# Patient Record
Sex: Male | Born: 1970 | Race: White | Hispanic: No | Marital: Married | Smoking: Never smoker
Health system: Southern US, Community
[De-identification: ages and names within clinical notes are randomized; demographics above are authoritative.]

## PROBLEM LIST (undated history)

## (undated) DIAGNOSIS — J45909 Unspecified asthma, uncomplicated: Secondary | ICD-10-CM

## (undated) HISTORY — PX: FOREARM SURGERY: SHX651

## (undated) HISTORY — PX: HERNIA REPAIR: SHX51

---

## 2005-03-16 ENCOUNTER — Inpatient Hospital Stay: Payer: Self-pay | Admitting: Unknown Physician Specialty

## 2006-12-11 IMAGING — CR DG CHEST 1V PORT
1 series · 1 of 1 positions shown · non-contrast
Comparison: none

REASON FOR EXAM: RIGHT WRIST FRACTURE
COMMENTS:

PROCEDURE:     DXR - DXR PORTABLE CHEST SINGLE VIEW  - March 17, 2005  [DATE]
RESULT:          No acute cardiopulmonary disease is noted.

[view not recorded]
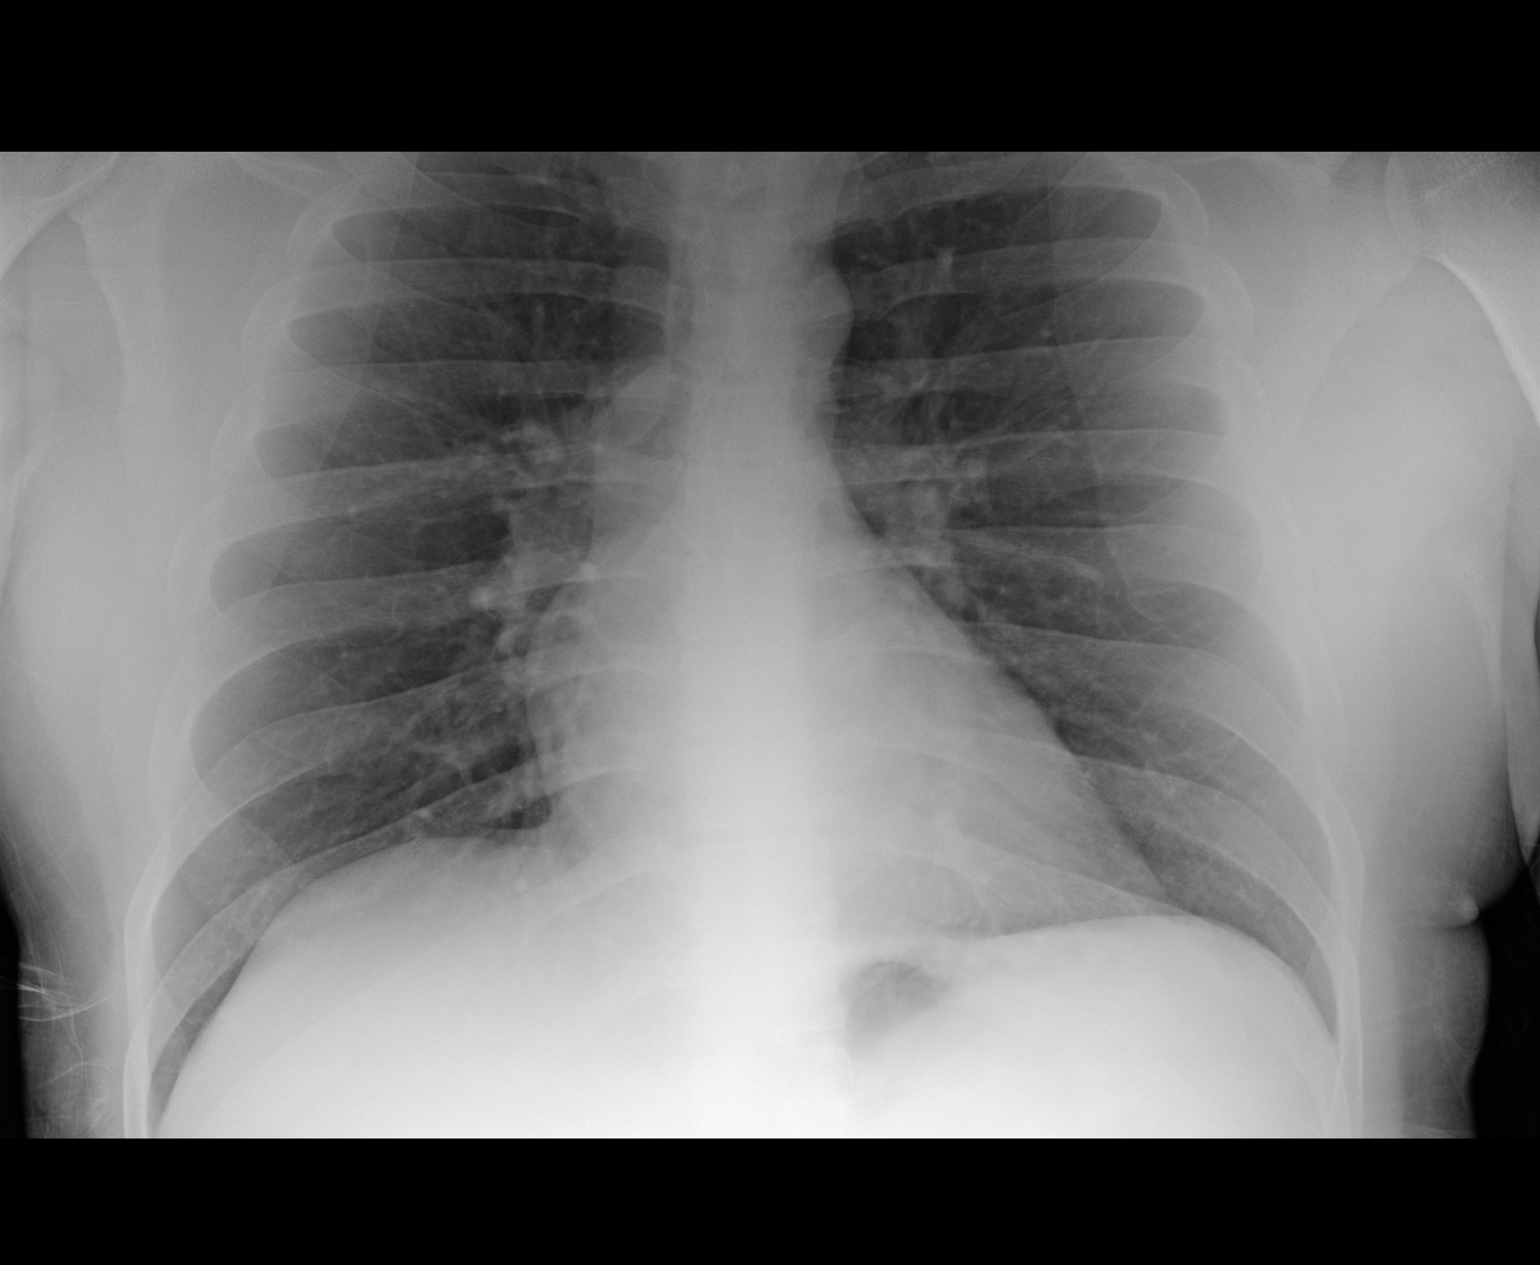

[1 of 1 positions shown; findings below may reference images not displayed]

IMPRESSION: No acute disease.

## 2007-04-17 ENCOUNTER — Emergency Department: Payer: Self-pay | Admitting: Emergency Medicine

## 2010-04-06 ENCOUNTER — Emergency Department: Payer: Self-pay | Admitting: Emergency Medicine

## 2011-04-30 ENCOUNTER — Emergency Department: Payer: Self-pay | Admitting: Emergency Medicine

## 2011-04-30 LAB — URINALYSIS, COMPLETE
Bilirubin,UR: NEGATIVE
Blood: NEGATIVE
Glucose,UR: NEGATIVE mg/dL (ref 0–75)
Ketone: NEGATIVE
Leukocyte Esterase: NEGATIVE
Ph: 5 (ref 4.5–8.0)
Specific Gravity: 1.026 (ref 1.003–1.030)
WBC UR: 2 /HPF (ref 0–5)

## 2011-04-30 LAB — CBC WITH DIFFERENTIAL/PLATELET
Basophil #: 0 10*3/uL (ref 0.0–0.1)
Basophil %: 0.2 %
Eosinophil #: 0.1 10*3/uL (ref 0.0–0.7)
Eosinophil %: 1.1 %
Lymphocyte %: 19.6 %
MCHC: 34 g/dL (ref 32.0–36.0)
MCV: 96 fL (ref 80–100)
Neutrophil #: 9.5 10*3/uL — ABNORMAL HIGH (ref 1.4–6.5)
RBC: 5.12 10*6/uL (ref 4.40–5.90)
RDW: 12.9 % (ref 11.5–14.5)

## 2011-04-30 LAB — COMPREHENSIVE METABOLIC PANEL
BUN: 13 mg/dL (ref 7–18)
Calcium, Total: 8.3 mg/dL — ABNORMAL LOW (ref 8.5–10.1)
Chloride: 103 mmol/L (ref 98–107)
Co2: 27 mmol/L (ref 21–32)
Creatinine: 0.88 mg/dL (ref 0.60–1.30)
EGFR (Non-African Amer.): >60
SGPT (ALT): 61 U/L

## 2015-02-13 ENCOUNTER — Emergency Department
Admission: EM | Admit: 2015-02-13 | Discharge: 2015-02-13 | Disposition: A | Discharge status: Home / Self Care | Payer: Self-pay | Attending: Emergency Medicine | Admitting: Emergency Medicine

## 2015-02-13 ENCOUNTER — Encounter: Payer: Self-pay | Admitting: Emergency Medicine

## 2015-02-13 DIAGNOSIS — E1165 Type 2 diabetes mellitus with hyperglycemia: Secondary | ICD-10-CM | POA: Insufficient documentation

## 2015-02-13 HISTORY — DX: Unspecified asthma, uncomplicated: J45.909

## 2015-02-13 LAB — CBC WITH DIFFERENTIAL/PLATELET
BASOS PCT: 1 %
Basophils Absolute: 0 10*3/uL (ref 0–0.1)
Eosinophils Absolute: 0.3 10*3/uL (ref 0–0.7)
Eosinophils Relative: 4 %
HEMATOCRIT: 45.3 % (ref 40.0–52.0)
Hemoglobin: 15.9 g/dL (ref 13.0–18.0)
Lymphocytes Relative: 34 %
Lymphs Abs: 2.6 10*3/uL (ref 1.0–3.6)
MCH: 32 pg (ref 26.0–34.0)
MCHC: 35.2 g/dL (ref 32.0–36.0)
MCV: 91 fL (ref 80.0–100.0)
MONO ABS: 0.5 10*3/uL (ref 0.2–1.0)
Monocytes Relative: 6 %
Neutro Abs: 4.1 10*3/uL (ref 1.4–6.5)
Neutrophils Relative %: 55 %
PLATELETS: 194 10*3/uL (ref 150–440)
RBC: 4.98 MIL/uL (ref 4.40–5.90)
RDW: 13 % (ref 11.5–14.5)
WBC: 7.5 10*3/uL (ref 3.8–10.6)

## 2015-02-13 LAB — COMPREHENSIVE METABOLIC PANEL
ALBUMIN: 4 g/dL (ref 3.5–5.0)
ALT: 25 U/L (ref 17–63)
AST: 19 U/L (ref 15–41)
Alkaline Phosphatase: 104 U/L (ref 38–126)
Anion gap: 7 (ref 5–15)
BILIRUBIN TOTAL: 1.2 mg/dL (ref 0.3–1.2)
BUN: 12 mg/dL (ref 6–20)
CHLORIDE: 101 mmol/L (ref 101–111)
CO2: 28 mmol/L (ref 22–32)
Calcium: 8.6 mg/dL — ABNORMAL LOW (ref 8.9–10.3)
Creatinine, Ser: 0.64 mg/dL (ref 0.61–1.24)
GFR calc Af Amer: >60 mL/min (ref >60)
GFR calc non Af Amer: >60 mL/min (ref >60)
GLUCOSE: 282 mg/dL — AB (ref 65–99)
POTASSIUM: 3.6 mmol/L (ref 3.5–5.1)
Sodium: 136 mmol/L (ref 135–145)
Total Protein: 7.3 g/dL (ref 6.5–8.1)

## 2015-02-13 LAB — TROPONIN I: Troponin I: <0.03 ng/mL (ref <0.031)

## 2015-02-13 MED ORDER — SODIUM CHLORIDE 0.9 % IV SOLN
1000.0000 mL | Freq: Once | INTRAVENOUS | Status: AC
  Administered 2015-02-13: 1000 mL via INTRAVENOUS

## 2015-02-13 MED ORDER — METFORMIN HCL 500 MG PO TABS: 500.0000 mg | ORAL_TABLET | Freq: Two times a day (BID) | ORAL | Status: AC

## 2015-02-13 NOTE — ED Notes (Signed)
States has felt fatigued x 2 months. States went to [place] clinic today and blood sugar was "HI". States is not a known diabetic.

## 2015-02-13 NOTE — ED Provider Notes (Signed)
[facility] Emergency Department Provider Note  ____________________________________________    I have reviewed the triage vital signs and the nursing notes.   HISTORY  Chief Complaint Fatigue  elevated glucose   HPI Erik Craig is a 45 y.o. male who presents with complaints of elevated blood sugar. Patient was sent from clinic with "high" glucose. Patient has no history of diabetes. He complains of thirst, frequent urination. Denies fevers or chills. No nausea no vomiting. No abdominal pain. No recent travel.     Past Medical History  Diagnosis Date  . Asthma     There are no active problems to display for this patient.   History reviewed. No pertinent past surgical history.  No current outpatient prescriptions on file.  Allergies Sulfa antibiotics  No family history on file.  Social History Social History  Substance Use Topics  . Smoking status: Never Smoker   . Smokeless tobacco: None  . Alcohol Use: No    Review of Systems  Constitutional: Negative for fever. Eyes: Negative for visual changes. ENT:  polydipsia Cardiovascular: Negative for chest pain. Respiratory: Negative for shortness of breath. No cough Gastrointestinal: Negative for abdominal pain, vomiting and diarrhea. Genitourinary: Negative for dysuria. Polyuria Musculoskeletal: Negative for back pain. Skin: Negative for rash. Neurological: Negative for headaches or focal weakness Psychiatric: No anxiety    ____________________________________________   PHYSICAL EXAM:  VITAL SIGNS: ED Triage Vitals  Enc Vitals Group     BP 02/13/15 1911 130/95 mmHg     Pulse Rate 02/13/15 1911 67     Resp 02/13/15 1911 18     Temp 02/13/15 1911 98.2 F (36.8 C)     Temp Source 02/13/15 1911 Oral     SpO2 02/13/15 1911 95 %     Weight 02/13/15 1911 231 lb (104.781 kg)     Height 02/13/15 1911  (1.753 m)     Head Cir --      Peak Flow --      Pain Score --       Pain Loc --      Pain Edu? --      Excl. in GC? --     Constitutional: Alert and oriented. Well appearing and in no distress. Eyes: Conjunctivae are normal.  ENT   Head: Normocephalic and atraumatic.   Mouth/Throat: Mucous membranes are moist. Cardiovascular: Normal rate, regular rhythm. Normal and symmetric distal pulses are present in all extremities. Respiratory: Normal respiratory effort without tachypnea nor retractions.  Gastrointestinal: Soft and non-tender in all quadrants. No distention. There is no CVA tenderness. Genitourinary: deferred Musculoskeletal: Nontender with normal range of motion in all extremities. No lower extremity tenderness nor edema. Neurologic:  Normal speech and language. No gross focal neurologic deficits are appreciated. Skin:  Skin is warm, dry and intact. No rash noted. Psychiatric: Mood and affect are normal. Patient exhibits appropriate insight and judgment.  ____________________________________________    LABS (pertinent positives/negatives)  Labs Reviewed  COMPREHENSIVE METABOLIC PANEL - Abnormal; Notable for the following:    Glucose, Bld 282 (*)    Calcium 8.6 (*)    All other components within normal limits  CBC WITH DIFFERENTIAL/PLATELET  TROPONIN I  URINALYSIS COMPLETEWITH MICROSCOPIC ([facility] ONLY)  CBG MONITORING, ED    ____________________________________________   EKG  ED ECG REPORT I, Jene Every, the attending physician, personally viewed and interpreted this ECG.  Date: 02/13/2015 EKG Time: 9:58 PM Rate: 57 Rhythm: Sinus bradycardia QRS Axis: normal Intervals: normal ST/T  Wave abnormalities: normal Conduction Disturbances: none Narrative Interpretation: unremarkable   ____________________________________________    RADIOLOGY I have personally reviewed any xrays that were ordered on this patient: None  ____________________________________________   PROCEDURES  Procedure(s) performed:  none  Critical Care performed: none  ____________________________________________   INITIAL IMPRESSION / ASSESSMENT AND PLAN / ED COURSE  Pertinent labs & imaging results that were available during my care of the patient were reviewed by me and considered in my medical decision making (see chart for details).  Patient sent from clinic with elevated glucose. We will check labs, give IV fluids and reevaluate.  Glucose on CMP is 282. LFTs are normal. I will start metformin and the patient will follow-up with his PCP in one week. Diabetes education provided by me  ____________________________________________   FINAL CLINICAL IMPRESSION(S) / ED DIAGNOSES  Final diagnoses:  Type 2 diabetes mellitus with hyperglycemia, without long-term current use of insulin (HCC)   new onset diabetes   Jene Every, MD 02/13/15 2252

## 2015-02-13 NOTE — Discharge Instructions (Signed)
Diabetes and Exercise Exercising regularly is important. It is not just about losing weight. It has many health benefits, such as:  Improving your overall fitness, flexibility, and endurance.  Increasing your bone density.  Helping with weight control.  Decreasing your body fat.  Increasing your muscle strength.  Reducing stress and tension.  Improving your overall health. People with diabetes who exercise gain additional benefits because exercise:  Reduces appetite.  Improves the body's use of blood sugar (glucose).  Helps lower or control blood glucose.  Decreases blood pressure.  Helps control blood lipids (such as cholesterol and triglycerides).  Improves the body's use of the hormone insulin by:  Increasing the body's insulin sensitivity.  Reducing the body's insulin needs.  Decreases the risk for heart disease because exercising:  Lowers cholesterol and triglycerides levels.  Increases the levels of good cholesterol (such as high-density lipoproteins [HDL]) in the body.  Lowers blood glucose levels. YOUR ACTIVITY PLAN  Choose an activity that you enjoy, and set realistic goals. To exercise safely, you should begin practicing any new physical activity slowly, and gradually increase the intensity of the exercise over time. Your health care provider or diabetes educator can help create an activity plan that works for you. General recommendations include:  Encouraging children to engage in at least 60 minutes of physical activity each day.  Stretching and performing strength training exercises, such as yoga or weight lifting, at least 2 times per week.  Performing a total of at least 150 minutes of moderate-intensity exercise each week, such as brisk walking or water aerobics.  Exercising at least 3 days per week, making sure you allow no more than 2 consecutive days to pass without exercising.  Avoiding long periods of inactivity (90 minutes or more). When you  have to spend an extended period of time sitting down, take frequent breaks to walk or stretch. RECOMMENDATIONS FOR EXERCISING WITH TYPE 1 OR TYPE 2 DIABETES   Check your blood glucose before exercising. If blood glucose levels are greater than 240 mg/dL, check for urine ketones. Do not exercise if ketones are present.  Avoid injecting insulin into areas of the body that are going to be exercised. For example, avoid injecting insulin into:  The arms when playing tennis.  The legs when jogging.  Keep a record of:  Food intake before and after you exercise.  Expected peak times of insulin action.  Blood glucose levels before and after you exercise.  The type and amount of exercise you have done.  Review your records with your health care provider. Your health care provider will help you to develop guidelines for adjusting food intake and insulin amounts before and after exercising.  If you take insulin or oral hypoglycemic agents, watch for signs and symptoms of hypoglycemia. They include:  Dizziness.  Shaking.  Sweating.  Chills.  Confusion.  Drink plenty of water while you exercise to prevent dehydration or heat stroke. Body water is lost during exercise and must be replaced.  Talk to your health care provider before starting an exercise program to make sure it is safe for you. Remember, almost any type of activity is better than none.   This information is not intended to replace advice given to you by your health care provider. Make sure you discuss any questions you have with your health care provider.   Document Released: 03/16/2003 Document Revised: 05/10/2014 Document Reviewed: 06/02/2012 Elsevier Interactive Patient Education 2016 [employer].  Diabetes Mellitus and Food It is important  for you to manage your blood sugar (glucose) level. Your blood glucose level can be greatly affected by what you eat. Eating healthier foods in the appropriate amounts throughout  the day at about the same time each day will help you control your blood glucose level. It can also help slow or prevent worsening of your diabetes mellitus. Healthy eating may even help you improve the level of your blood pressure and reach or maintain a healthy weight.  General recommendations for healthful eating and cooking habits include:  Eating meals and snacks regularly. Avoid going long periods of time without eating to lose weight.  Eating a diet that consists mainly of plant-based foods, such as fruits, vegetables, nuts, legumes, and whole grains.  Using low-heat cooking methods, such as baking, instead of high-heat cooking methods, such as deep frying. Work with your dietitian to make sure you understand how to use the Nutrition Facts information on food labels. HOW CAN FOOD AFFECT ME? Carbohydrates Carbohydrates affect your blood glucose level more than any other type of food. Your dietitian will help you determine how many carbohydrates to eat at each meal and teach you how to count carbohydrates. Counting carbohydrates is important to keep your blood glucose at a healthy level, especially if you are using insulin or taking certain medicines for diabetes mellitus. Alcohol Alcohol can cause sudden decreases in blood glucose (hypoglycemia), especially if you use insulin or take certain medicines for diabetes mellitus. Hypoglycemia can be a life-threatening condition. Symptoms of hypoglycemia (sleepiness, dizziness, and disorientation) are similar to symptoms of having too much alcohol.  If your health care provider has given you approval to drink alcohol, do so in moderation and use the following guidelines:  Women should not have more than one drink per day, and men should not have more than two drinks per day. One drink is equal to:  12 oz of beer.  5 oz of wine.  1 oz of hard liquor.  Do not drink on an empty stomach.  Keep yourself hydrated. Have water, diet soda, or  unsweetened iced tea.  Regular soda, juice, and other mixers might contain a lot of carbohydrates and should be counted. WHAT FOODS ARE NOT RECOMMENDED? As you make food choices, it is important to remember that all foods are not the same. Some foods have fewer nutrients per serving than other foods, even though they might have the same number of calories or carbohydrates. It is difficult to get your body what it needs when you eat foods with fewer nutrients. Examples of foods that you should avoid that are high in calories and carbohydrates but low in nutrients include:  Trans fats (most processed foods list trans fats on the Nutrition Facts label).  Regular soda.  Juice.  Candy.  Sweets, such as cake, pie, doughnuts, and cookies.  Fried foods. WHAT FOODS CAN I EAT? Eat nutrient-rich foods, which will nourish your body and keep you healthy. The food you should eat also will depend on several factors, including:  The calories you need.  The medicines you take.  Your weight.  Your blood glucose level.  Your blood pressure level.  Your cholesterol level. You should eat a variety of foods, including:  Protein.  Lean cuts of meat.  Proteins low in saturated fats, such as fish, egg whites, and beans. Avoid processed meats.  Fruits and vegetables.  Fruits and vegetables that may help control blood glucose levels, such as apples, mangoes, and yams.  Dairy products.  Choose  fat-free or low-fat dairy products, such as milk, yogurt, and cheese.  Grains, bread, pasta, and rice.  Choose whole grain products, such as multigrain bread, whole oats, and brown rice. These foods may help control blood pressure.  Fats.  Foods containing healthful fats, such as nuts, avocado, olive oil, canola oil, and fish. DOES EVERYONE WITH DIABETES MELLITUS HAVE THE SAME MEAL PLAN? Because every person with diabetes mellitus is different, there is not one meal plan that works for everyone. It  is very important that you meet with a dietitian who will help you create a meal plan that is just right for you.   This information is not intended to replace advice given to you by your health care provider. Make sure you discuss any questions you have with your health care provider.   Document Released: 09/20/2004 Document Revised: 01/14/2014 Document Reviewed: 11/20/2012 Elsevier Interactive Patient Education 2016 Elsevier Inc[employer]  Hyperglycemia Hyperglycemia occurs when the glucose (sugar) in your blood is too high. Hyperglycemia can happen for many reasons, but it most often happens to people who do not know they have diabetes or are not managing their diabetes properly.  CAUSES  Whether you have diabetes or not, there are other causes of hyperglycemia. Hyperglycemia can occur when you have diabetes, but it can also occur in other situations that you might not be as aware of, such as: Diabetes  If you have diabetes and are having problems controlling your blood glucose, hyperglycemia could occur because of some of the following reasons:  Not following your meal plan.  Not taking your diabetes medications or not taking it properly.  Exercising less or doing less activity than you normally do.  Being sick. Pre-diabetes  This cannot be ignored. Before people develop Type 2 diabetes, they almost always have "pre-diabetes." This is when your blood glucose levels are higher than normal, but not yet high enough to be diagnosed as diabetes. Research has shown that some long-term damage to the body, especially the heart and circulatory system, may already be occurring during pre-diabetes. If you take action to manage your blood glucose when you have pre-diabetes, you may delay or prevent Type 2 diabetes from developing. Stress  If you have diabetes, you may be "diet" controlled or on oral medications or insulin to control your diabetes. However, you may find that your blood glucose is higher  than usual in the hospital whether you have diabetes or not. This is often referred to as "stress hyperglycemia." Stress can elevate your blood glucose. This happens because of hormones put out by the body during times of stress. If stress has been the cause of your high blood glucose, it can be followed regularly by your caregiver. That way he/she can make sure your hyperglycemia does not continue to get worse or progress to diabetes. Steroids  Steroids are medications that act on the infection fighting system (immune system) to block inflammation or infection. One side effect can be a rise in blood glucose. Most people can produce enough extra insulin to allow for this rise, but for those who cannot, steroids make blood glucose levels go even higher. It is not unusual for steroid treatments to "uncover" diabetes that is developing. It is not always possible to determine if the hyperglycemia will go away after the steroids are stopped. A special blood test called an A1c is sometimes done to determine if your blood glucose was elevated before the steroids were started. SYMPTOMS  Thirsty.  Frequent urination.  Dry  mouth.  Blurred vision.  Tired or fatigue.  Weakness.  Sleepy.  Tingling in feet or leg. DIAGNOSIS  Diagnosis is made by monitoring blood glucose in one or all of the following ways:  A1c test. This is a chemical found in your blood.  Fingerstick blood glucose monitoring.  Laboratory results. TREATMENT  First, knowing the cause of the hyperglycemia is important before the hyperglycemia can be treated. Treatment may include, but is not be limited to:  Education.  Change or adjustment in medications.  Change or adjustment in meal plan.  Treatment for an illness, infection, etc.  More frequent blood glucose monitoring.  Change in exercise plan.  Decreasing or stopping steroids.  Lifestyle changes. HOME CARE INSTRUCTIONS   Test your blood glucose as  directed.  Exercise regularly. Your caregiver will give you instructions about exercise. Pre-diabetes or diabetes which comes on with stress is helped by exercising.  Eat wholesome, balanced meals. Eat often and at regular, fixed times. Your caregiver or nutritionist will give you a meal plan to guide your sugar intake.  Being at an ideal weight is important. If needed, losing as little as 10 to 15 pounds may help improve blood glucose levels. SEEK MEDICAL CARE IF:   You have questions about medicine, activity, or diet.  You continue to have symptoms (problems such as increased thirst, urination, or weight gain). SEEK IMMEDIATE MEDICAL CARE IF:   You are vomiting or have diarrhea.  Your breath smells fruity.  You are breathing faster or slower.  You are very sleepy or incoherent.  You have numbness, tingling, or pain in your feet or hands.  You have chest pain.  Your symptoms get worse even though you have been following your caregiver's orders.  If you have any other questions or concerns.   This information is not intended to replace advice given to you by your health care provider. Make sure you discuss any questions you have with your health care provider.   Document Released: 06/19/2000 Document Revised: 03/18/2011 Document Reviewed: 08/30/2014 Elsevier Interactive Patient Education [employer]

## 2015-02-13 NOTE — ED Notes (Signed)
Pt discharged to home with family.  Discharge papetrwork reviewed with patient and family.  Prescriptions given to pa tien

## 2017-07-23 ENCOUNTER — Other Ambulatory Visit: Payer: Self-pay

## 2017-07-23 ENCOUNTER — Ambulatory Visit
Admission: EM | Admit: 2017-07-23 | Discharge: 2017-07-23 | Disposition: A | Discharge status: Home / Self Care | Payer: Self-pay | Attending: Family Medicine | Admitting: Family Medicine

## 2017-07-23 DIAGNOSIS — A084 Viral intestinal infection, unspecified: Secondary | ICD-10-CM

## 2017-07-23 DIAGNOSIS — M791 Myalgia, unspecified site: Secondary | ICD-10-CM

## 2017-07-23 DIAGNOSIS — R110 Nausea: Secondary | ICD-10-CM

## 2017-07-23 DIAGNOSIS — R109 Unspecified abdominal pain: Secondary | ICD-10-CM

## 2017-07-23 LAB — COMPREHENSIVE METABOLIC PANEL
ALBUMIN: 3.9 g/dL (ref 3.5–5.0)
ALK PHOS: 83 U/L (ref 38–126)
ALT: 36 U/L (ref 0–44)
ANION GAP: 12 (ref 5–15)
AST: 37 U/L (ref 15–41)
BILIRUBIN TOTAL: 1 mg/dL (ref 0.3–1.2)
BUN: 13 mg/dL (ref 6–20)
CALCIUM: 8.4 mg/dL — AB (ref 8.9–10.3)
CO2: 22 mmol/L (ref 22–32)
CREATININE: 0.73 mg/dL (ref 0.61–1.24)
Chloride: 102 mmol/L (ref 98–111)
GFR calc Af Amer: >60 mL/min (ref >60)
GFR calc non Af Amer: >60 mL/min (ref >60)
Glucose, Bld: 283 mg/dL — ABNORMAL HIGH (ref 70–99)
Potassium: 3.6 mmol/L (ref 3.5–5.1)
Sodium: 136 mmol/L (ref 135–145)
TOTAL PROTEIN: 7.4 g/dL (ref 6.5–8.1)

## 2017-07-23 LAB — CBC WITH DIFFERENTIAL/PLATELET
Basophils Absolute: 0.1 10*3/uL (ref 0–0.1)
Basophils Relative: 1 %
Eosinophils Absolute: 0 10*3/uL (ref 0–0.7)
Eosinophils Relative: 1 %
HEMATOCRIT: 44.6 % (ref 40.0–52.0)
HEMOGLOBIN: 15.5 g/dL (ref 13.0–18.0)
LYMPHS ABS: 1.1 10*3/uL (ref 1.0–3.6)
Lymphocytes Relative: 28 %
MCH: 31.9 pg (ref 26.0–34.0)
MCHC: 34.7 g/dL (ref 32.0–36.0)
MCV: 91.8 fL (ref 80.0–100.0)
MONOS PCT: 11 %
Monocytes Absolute: 0.4 10*3/uL (ref 0.2–1.0)
NEUTROS ABS: 2.2 10*3/uL (ref 1.4–6.5)
NEUTROS PCT: 59 %
Platelets: 117 10*3/uL — ABNORMAL LOW (ref 150–440)
RBC: 4.86 MIL/uL (ref 4.40–5.90)
RDW: 13 % (ref 11.5–14.5)
WBC: 3.8 10*3/uL (ref 3.8–10.6)

## 2017-07-23 LAB — LIPASE, BLOOD: Lipase: 28 U/L (ref 11–51)

## 2017-07-23 MED ORDER — ONDANSETRON 8 MG PO TBDP: 8.0000 mg | ORAL_TABLET | Freq: Three times a day (TID) | ORAL | 0 refills | Status: AC | PRN

## 2017-07-23 NOTE — ED Provider Notes (Signed)
[facility]    CSN: [id number] Arrival date & time: 07/23/17  1102     History   Chief Complaint Chief Complaint  Patient presents with  . Abdominal Pain    HPI Erik Craig is a 47 y.o. male.   47 yo male with a c/o abdominal pains, nausea, chills, sweats, body aches, and loose stools for the past 3 days. Denies any vomiting, melena, hematochezia. States he ate out spicy foods prior to symptoms starting.   The history is provided by the patient.  Abdominal Pain    Past Medical History:  Diagnosis Date  . Asthma     There are no active problems to display for this patient.   Past Surgical History:  Procedure Laterality Date  . FOREARM SURGERY    . HERNIA REPAIR         Home Medications    Prior to Admission medications   Medication Sig Start Date End Date Taking? Authorizing Provider  cetirizine (ZYRTEC) 10 MG tablet Take 10 mg by mouth daily.   Yes [provider]  metFORMIN (GLUCOPHAGE) 500 MG tablet Take 1 tablet (500 mg total) by mouth 2 (two) times daily with a meal. 02/13/15 02/13/16  Jene Every, MD  ondansetron (ZOFRAN ODT) 8 MG disintegrating tablet Take 1 tablet (8 mg total) by mouth every 8 (eight) hours as needed. 07/23/17   Payton Mccallum, MD    Family History Family History  Problem Relation Age of Onset  . Diabetes Mellitus I Father     Social History Social History   Tobacco Use  . Smoking status: Never Smoker  . Smokeless tobacco: Never Used  Substance Use Topics  . Alcohol use: No  . Drug use: Never     Allergies   Sulfa antibiotics   Review of Systems Review of Systems  Gastrointestinal: Positive for abdominal pain.     Physical Exam Triage Vital Signs ED Triage Vitals  Enc Vitals Group     BP 07/23/17 1118 (!) 146/86     Pulse Rate 07/23/17 1118 90     Resp 07/23/17 1118 18     Temp 07/23/17 1118 98.4 F (36.9 C)     Temp Source 07/23/17 1118 Oral     SpO2 07/23/17 1118 99 %     Weight  07/23/17 1115 240 lb (108.9 kg)     Height 07/23/17 1115 5\' 9"  (1.753 m)     Head Circumference --      Peak Flow --      Pain Score 07/23/17 1115 8     Pain Loc --      Pain Edu? --      Excl. in GC? --    No data found.  Updated Vital Signs BP (!) 146/86 (BP Location: Left Arm)   Pulse 90   Temp 98.4 F (36.9 C) (Oral)   Resp 18   Ht 5\' 9"  (1.753 m)   Wt 240 lb (108.9 kg)   SpO2 99%   BMI 35.44 kg/m   Visual Acuity Right Eye Distance:   Left Eye Distance:   Bilateral Distance:    Right Eye Near:   Left Eye Near:    Bilateral Near:     Physical Exam  Constitutional: He is oriented to person, place, and time. He appears well-developed and well-nourished. No distress.  HENT:  Head: Normocephalic and atraumatic.  Cardiovascular: Normal rate, regular rhythm, normal heart sounds and intact distal pulses.  No murmur heard.  Pulmonary/Chest: Effort normal and breath sounds normal. No respiratory distress. He has no wheezes. He has no rales.  Abdominal: Soft. Bowel sounds are normal. He exhibits no distension and no mass. There is tenderness (mild; diffuse). There is no rebound and no guarding.  Neurological: He is alert and oriented to person, place, and time.  Skin: No rash noted. He is not diaphoretic.  Nursing note and vitals reviewed.    UC Treatments / Results  Labs (all labs ordered are listed, but only abnormal results are displayed) Labs Reviewed  CBC WITH DIFFERENTIAL/PLATELET - Abnormal; Notable for the following components:      Result Value   Platelets 117 (*)    All other components within normal limits  COMPREHENSIVE METABOLIC PANEL - Abnormal; Notable for the following components:   Glucose, Bld 283 (*)    Calcium 8.4 (*)    All other components within normal limits  LIPASE, BLOOD    EKG None  Radiology No results found.  Procedures Procedures (including critical care time)  Medications Ordered in UC Medications - No data to  display  Initial Impression / Assessment and Plan / UC Course  I have reviewed the triage vital signs and the nursing notes.  Pertinent labs & imaging results that were available during my care of the patient were reviewed by me and considered in my medical decision making (see chart for details).      Final Clinical Impressions(s) / UC Diagnoses   Final diagnoses:  Viral gastroenteritis   Discharge Instructions   None    ED Prescriptions    Medication Sig Dispense Auth. Provider   ondansetron (ZOFRAN ODT) 8 MG disintegrating tablet Take 1 tablet (8 mg total) by mouth every 8 (eight) hours as needed. 6 tablet Payton Mccallumonty, Cherissa Hook, MD      1. Lab results and diagnosis reviewed with patient 2. rx as per orders above; reviewed possible side effects, interactions, risks and benefits  3. Recommend supportive treatment with clear liquids, then advance slowly as tolerated 4. Follow-up prn if symptoms worsen or don't improve Controlled Substance Prescriptions [place] Controlled Substance Registry consulted? Not Applicable   Payton Mccallumonty, Oneill Bais, MD 07/23/17 2038

## 2017-07-23 NOTE — ED Triage Notes (Signed)
Patient complains of abdominal pain, sweating, rash that he has noticed in areas, body aches that he states that feels like his "bones hurt". Patient states that this started on Friday after eating spicy food.

## 2018-12-29 ENCOUNTER — Ambulatory Visit: Payer: Self-pay | Attending: Internal Medicine

## 2018-12-29 ENCOUNTER — Other Ambulatory Visit: Payer: Self-pay

## 2018-12-29 DIAGNOSIS — Z20822 Contact with and (suspected) exposure to covid-19: Secondary | ICD-10-CM

## 2018-12-30 ENCOUNTER — Telehealth: Payer: Self-pay | Admitting: *Deleted

## 2018-12-30 NOTE — Telephone Encounter (Signed)
Pt calling for results, active, aware not resulted yet, verbalizes understanding.

## 2018-12-31 LAB — NOVEL CORONAVIRUS, NAA: SARS-CoV-2, NAA: NOT DETECTED
